# Patient Record
Sex: Female | Born: 2000 | Race: White | Hispanic: No | Marital: Single | Smoking: Never smoker
Health system: Southern US, Community
[De-identification: ages and names within clinical notes are randomized; demographics above are authoritative.]

## PROBLEM LIST (undated history)

## (undated) DIAGNOSIS — L409 Psoriasis, unspecified: Secondary | ICD-10-CM

## (undated) DIAGNOSIS — E079 Disorder of thyroid, unspecified: Secondary | ICD-10-CM

## (undated) DIAGNOSIS — K900 Celiac disease: Secondary | ICD-10-CM

## (undated) DIAGNOSIS — D649 Anemia, unspecified: Secondary | ICD-10-CM

## (undated) DIAGNOSIS — F419 Anxiety disorder, unspecified: Secondary | ICD-10-CM

## (undated) HISTORY — DX: Celiac disease: K90.0

## (undated) HISTORY — DX: Anemia, unspecified: D64.9

---
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
## 2020-03-13 ENCOUNTER — Other Ambulatory Visit: Payer: Self-pay

## 2020-03-13 DIAGNOSIS — Z20822 Contact with and (suspected) exposure to covid-19: Secondary | ICD-10-CM

## 2020-03-16 LAB — NOVEL CORONAVIRUS, NAA: SARS-CoV-2, NAA: NOT DETECTED

## 2020-08-13 ENCOUNTER — Ambulatory Visit (INDEPENDENT_AMBULATORY_CARE_PROVIDER_SITE_OTHER): Payer: BC Managed Care – PPO

## 2020-08-13 ENCOUNTER — Other Ambulatory Visit: Payer: Self-pay

## 2020-08-13 ENCOUNTER — Ambulatory Visit
Admission: EM | Admit: 2020-08-13 | Discharge: 2020-08-13 | Disposition: A | Discharge status: Home / Self Care | Payer: BC Managed Care – PPO | Attending: Family Medicine | Admitting: Family Medicine

## 2020-08-13 DIAGNOSIS — R109 Unspecified abdominal pain: Secondary | ICD-10-CM

## 2020-08-13 LAB — POCT URINALYSIS DIP (DEVICE)
Bilirubin Urine: NEGATIVE
Glucose, UA: NEGATIVE mg/dL
Hgb urine dipstick: NEGATIVE
Ketones, ur: NEGATIVE mg/dL
Leukocytes,Ua: NEGATIVE
Nitrite: NEGATIVE
Protein, ur: NEGATIVE mg/dL
Specific Gravity, Urine: 1.015 (ref 1.005–1.030)
Urobilinogen, UA: 0.2 mg/dL (ref 0.0–1.0)
pH: 5.5 (ref 5.0–8.0)

## 2020-08-13 LAB — POCT PREGNANCY, URINE: Preg Test, Ur: NEGATIVE

## 2020-08-13 NOTE — ED Provider Notes (Signed)
[facility]    CSN: [id number] Arrival date & time: 08/13/20  1759  History   Chief Complaint Chief Complaint  Patient presents with   Flank Pain   HPI  20 year old female presents with the above complaint.  Patient reports that she developed low grade fever yesterday (Tmax 99.6). Also, developed R flank pain. She reports urinary frequency. No dysuria. She reports nausea. No vomiting. No relieving factors. No reports of hematuria. Pain 5/10 in severity.   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 No pertinent surgical history.  OB History   No obstetric history on file.      Home Medications    Prior to Admission medications   Medication Sig Start Date End Date Taking? Authorizing Provider  escitalopram (LEXAPRO) 5 MG tablet Take by mouth. 07/30/20  Yes [provider]  VYVANSE 10 MG CHEW Chew 1 tablet by mouth daily. 03/04/20  Yes [provider]  sucralfate (CARAFATE) 1 g tablet Take 1 tablet (1 g total) by mouth 2 (two) times daily as needed. 10/27/18 10/27/18  Verlee Monte, NP    Family History Family History  Problem Relation Age of Onset   Healthy Mother    Hypertension Father     Social History Social History   Tobacco Use   Smoking status: Never   Smokeless tobacco: Never  Vaping Use   Vaping Use: Never used  Substance Use Topics   Alcohol use: Never   Drug use: Never     Allergies   Sulfa  antibiotics   Review of Systems Review of Systems Per HPI  Physical Exam Triage Vital Signs ED Triage Vitals  Enc Vitals Group     BP 08/13/20 1837 109/69     Pulse Rate 08/13/20 1837 95     Resp 08/13/20 1837 18     Temp 08/13/20 1837 99 F (37.2 C)     Temp Source 08/13/20 1837 Oral     SpO2 08/13/20 1837 99 %     Weight 08/13/20 1834 92 lb (41.7 kg)     Height 08/13/20 1834 5\' 1"  (1.549 m)     Head Circumference --      Peak Flow --      Pain Score 08/13/20 1834 5     Pain Loc --      Pain Edu? --      Excl. in GC? --    Updated Vital Signs BP 109/69 (BP Location: Left Arm)   Pulse 95   Temp 99 F (37.2 C) (Oral)   Resp 18   Ht 5\' 1"  (1.549 m)   Wt 41.7 kg   LMP 07/26/2020 Comment: neg preg test 08/13/20  SpO2 99%   BMI 17.38 kg/m   Visual Acuity Right Eye Distance:   Left Eye Distance:   Bilateral Distance:    Right Eye Near:   Left Eye Near:    Bilateral Near:  Physical Exam Vitals and nursing note reviewed.  Constitutional:      Appearance: Normal appearance.  HENT:     Head: Normocephalic and atraumatic.  Eyes:     General:        Right eye: No discharge.        Left eye: No discharge.     Conjunctiva/sclera: Conjunctivae normal.  Cardiovascular:     Rate and Rhythm: Normal rate and regular rhythm.  Pulmonary:     Effort: Pulmonary effort is normal.     Breath sounds: Normal breath sounds. No wheezing, rhonchi or rales.  Neurological:     Mental Status: She is alert.  Psychiatric:        Mood and Affect: Mood normal.        Behavior: Behavior normal.    UC Treatments / Results  Labs (all labs ordered are listed, but only abnormal results are displayed) Labs Reviewed  PREGNANCY, URINE  POCT URINALYSIS DIPSTICK, ED / UC  POCT PREGNANCY, URINE  POCT URINALYSIS DIP (DEVICE)    EKG   Radiology No results found.  Procedures Procedures (including critical care time)  Medications Ordered in UC Medications - No data to  display  Initial Impression / Assessment and Plan / UC Course  I have reviewed the triage vital signs and the nursing notes.  Pertinent labs & imaging results that were available during my care of the patient were reviewed by me and considered in my medical decision making (see chart for details).    20 year old female presents with flank pain. Etiology & prognosis unclear at this time. UA negative. No evidence of hematuria or pyuria. KUB obtained and was independently reviewed by me. Interpretation: Normal. No evidence of stone. Exam benign. Overall, well appearing. Advised tylenol and supportive care. If worsens, advised to go to the ER for further evaluation.   Final Clinical Impressions(s) / UC Diagnoses   Final diagnoses:  Flank pain     Discharge Instructions      No evidence of UTI or kidney stone.  Her exam is unremarkable.   Tylenol as needed for pain. Keep a close eye. If she worsens, please take her to the ER for advanced imaging (CT) and labs.  Follow up with your PCP.  Take care  Dr. Adriana Simas    ED Prescriptions   None    PDMP not reviewed this encounter.   Tommie Sams, [place] 08/15/20 2301

## 2020-08-13 NOTE — Discharge Instructions (Addendum)
No evidence of UTI or kidney stone.  Her exam is unremarkable.   Tylenol as needed for pain. Keep a close eye. If she worsens, please take her to the ER for advanced imaging (CT) and labs.  Follow up with your PCP.  Take care  Dr. Adriana Simas

## 2020-08-13 NOTE — ED Triage Notes (Signed)
Patient states that she has been having a fever, back pain and low back pain since yesterday.

## 2021-08-20 ENCOUNTER — Ambulatory Visit
Admission: EM | Admit: 2021-08-20 | Discharge: 2021-08-20 | Disposition: A | Discharge status: Home / Self Care | Payer: BC Managed Care – PPO | Attending: Emergency Medicine | Admitting: Emergency Medicine

## 2021-08-20 DIAGNOSIS — L03031 Cellulitis of right toe: Secondary | ICD-10-CM

## 2021-08-20 MED ORDER — CHLORHEXIDINE GLUCONATE 4 % EX LIQD: Freq: Every day | CUTANEOUS | 0 refills | Status: DC | PRN

## 2021-08-20 MED ORDER — CEPHALEXIN 500 MG PO CAPS: 1000.0000 mg | ORAL_CAPSULE | Freq: Two times a day (BID) | ORAL | 0 refills | Status: AC

## 2021-08-20 NOTE — ED Provider Notes (Signed)
HPI  SUBJECTIVE:  Dawn Vaughan is a 21 y.o. female who presents with 2 days of erythema, swelling, increased temperature, throbbing constant pain at the lateral fold of her right first toe after picking at the skin.  She reports purulent drainage and erythema streaking up her toe.  No fevers, body aches.  She tried triple antibiotic ointment and wrapping it without improvement in her symptoms.  Symptoms are worse with walking and palpation.  Past medical history negative for MRSA.  LMP: Last night.  PCP: [facility] [place]   Past Medical History:  Diagnosis Date   Anxiety    Psoriasis    Thyroid disease     History reviewed. No pertinent surgical history.  Family History  Problem Relation Age of Onset   Healthy Mother    Hypertension Father     Social History   Tobacco Use   Smoking status: Never   Smokeless tobacco: Never  Vaping Use   Vaping Use: Never used  Substance Use Topics   Alcohol use: Never   Drug use: Never    No current facility-administered medications for this encounter.  Current Outpatient Medications:    cephALEXin (KEFLEX) 500 MG capsule, Take 2 capsules (1,000 mg total) by mouth 2 (two) times daily for 7 days., Disp: 28 capsule, Rfl: 0   chlorhexidine (HIBICLENS) 4 % external liquid, Apply topically daily as needed. Dilute 10-15 mL in water, Use daily when bathing for 1-2 weeks, Disp: 120 mL, Rfl: 0   escitalopram (LEXAPRO) 5 MG tablet, Take by mouth., Disp: , Rfl:    VYVANSE 10 MG CHEW, Chew 1 tablet by mouth daily., Disp: , Rfl:   Allergies  Allergen Reactions   Sulfa Antibiotics Hives     ROS  As noted in HPI.   Physical Exam  BP (!) 114/55 (BP Location: Left Arm)   Pulse 80   Temp 98.2 F (36.8 C) (Oral)   SpO2 99%   Constitutional: Well developed, well nourished, no acute distress Eyes:  EOMI, conjunctiva normal bilaterally HENT: Normocephalic, atraumatic,mucus membranes moist Respiratory: Normal inspiratory  effort Cardiovascular: Normal rate GI: nondistended skin: No rash, skin intact Musculoskeletal: Tender erythema, mild edema lateral nail fold right first toe.  No expressible purulent drainage.  No tenderness, swelling of the pad.   Neurologic: Alert & oriented x 3, no focal neuro deficits Psychiatric: Speech and behavior appropriate   ED Course   Medications - No data to display  No orders of the defined types were placed in this encounter.   No results found for this or any previous visit (from the past 24 hour(s)). No results found.  ED Clinical Impression  1. Cellulitis of right toe      ED Assessment/Plan  Patient with a cellulitis/early paronychia.  There does not appear to be anything to drain.  No evidence of felon.  With Keflex for 7 days, Hibiclens, Tylenol/ibuprofen.  Follow-up here with PCP as needed.  Discussed MDM, treatment plan, and plan for follow-up with patient.  patient agrees with plan.   Meds ordered this encounter  Medications   chlorhexidine (HIBICLENS) 4 % external liquid    Sig: Apply topically daily as needed. Dilute 10-15 mL in water, Use daily when bathing for 1-2 weeks    Dispense:  120 mL    Refill:  0   cephALEXin (KEFLEX) 500 MG capsule    Sig: Take 2 capsules (1,000 mg total) by mouth 2 (two) times daily for 7 days.  Dispense:  28 capsule    Refill:  0      *This clinic note was created using Scientist, clinical (histocompatibility and immunogenetics). Therefore, there may be occasional mistakes despite careful proofreading.  ?    Domenick Gong, MD 08/20/21 [phone]

## 2021-08-20 NOTE — ED Triage Notes (Signed)
Pt reports right foot big toe pain. She has been picking at the skin and it appears infected.

## 2021-08-20 NOTE — Discharge Instructions (Addendum)
Keep clean with Hibiclens soaks.  Continue antibiotic ointment.  Finish Keflex, even if you feel better.  400 mg of ibuprofen combined with 1000 mg of Tylenol 3-4 times a day as needed for pain.  Return here or see your doctor if you get worse.

## 2021-12-16 IMAGING — CR DG ABDOMEN 1V
2 series · 2 of 2 positions shown · non-contrast
Comparison: Chest radiograph 10/27/2018

CLINICAL DATA: Fever, back pain

EXAM:
ABDOMEN - 1 VIEW

[abdomen kub (1 of 2)]
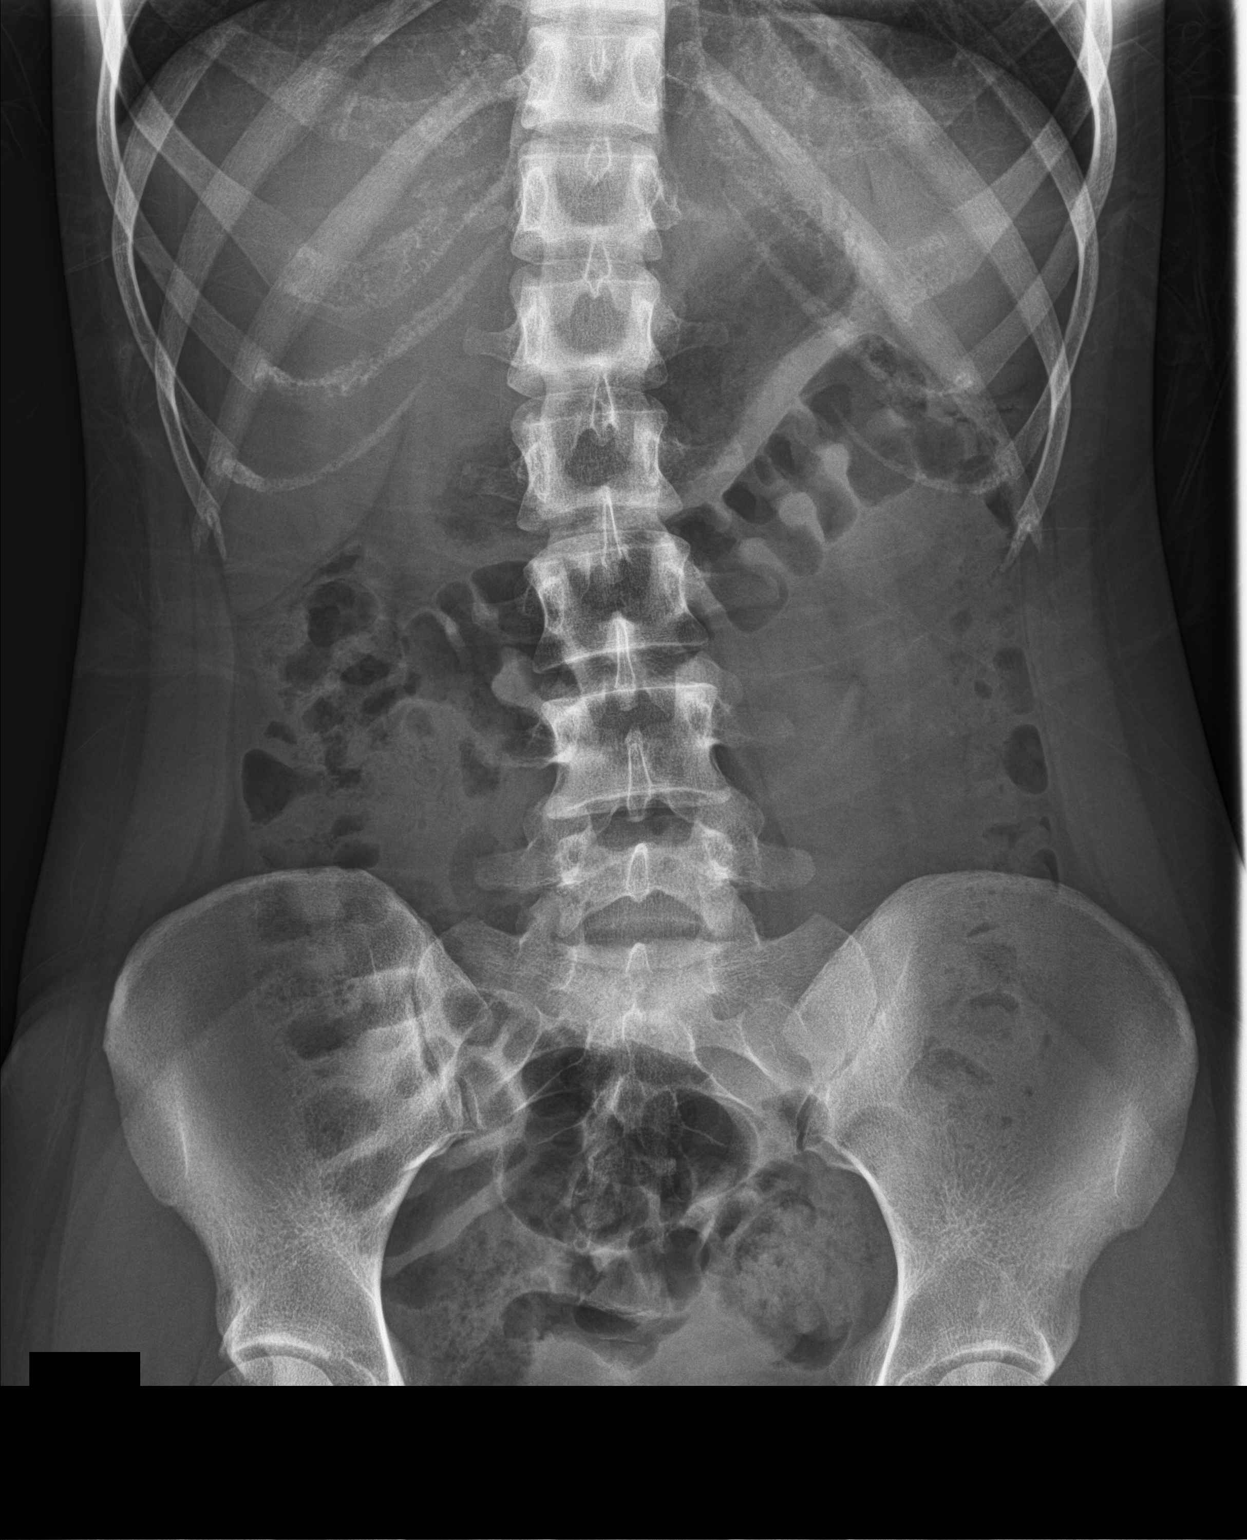

[abdomen kub (2 of 2)]
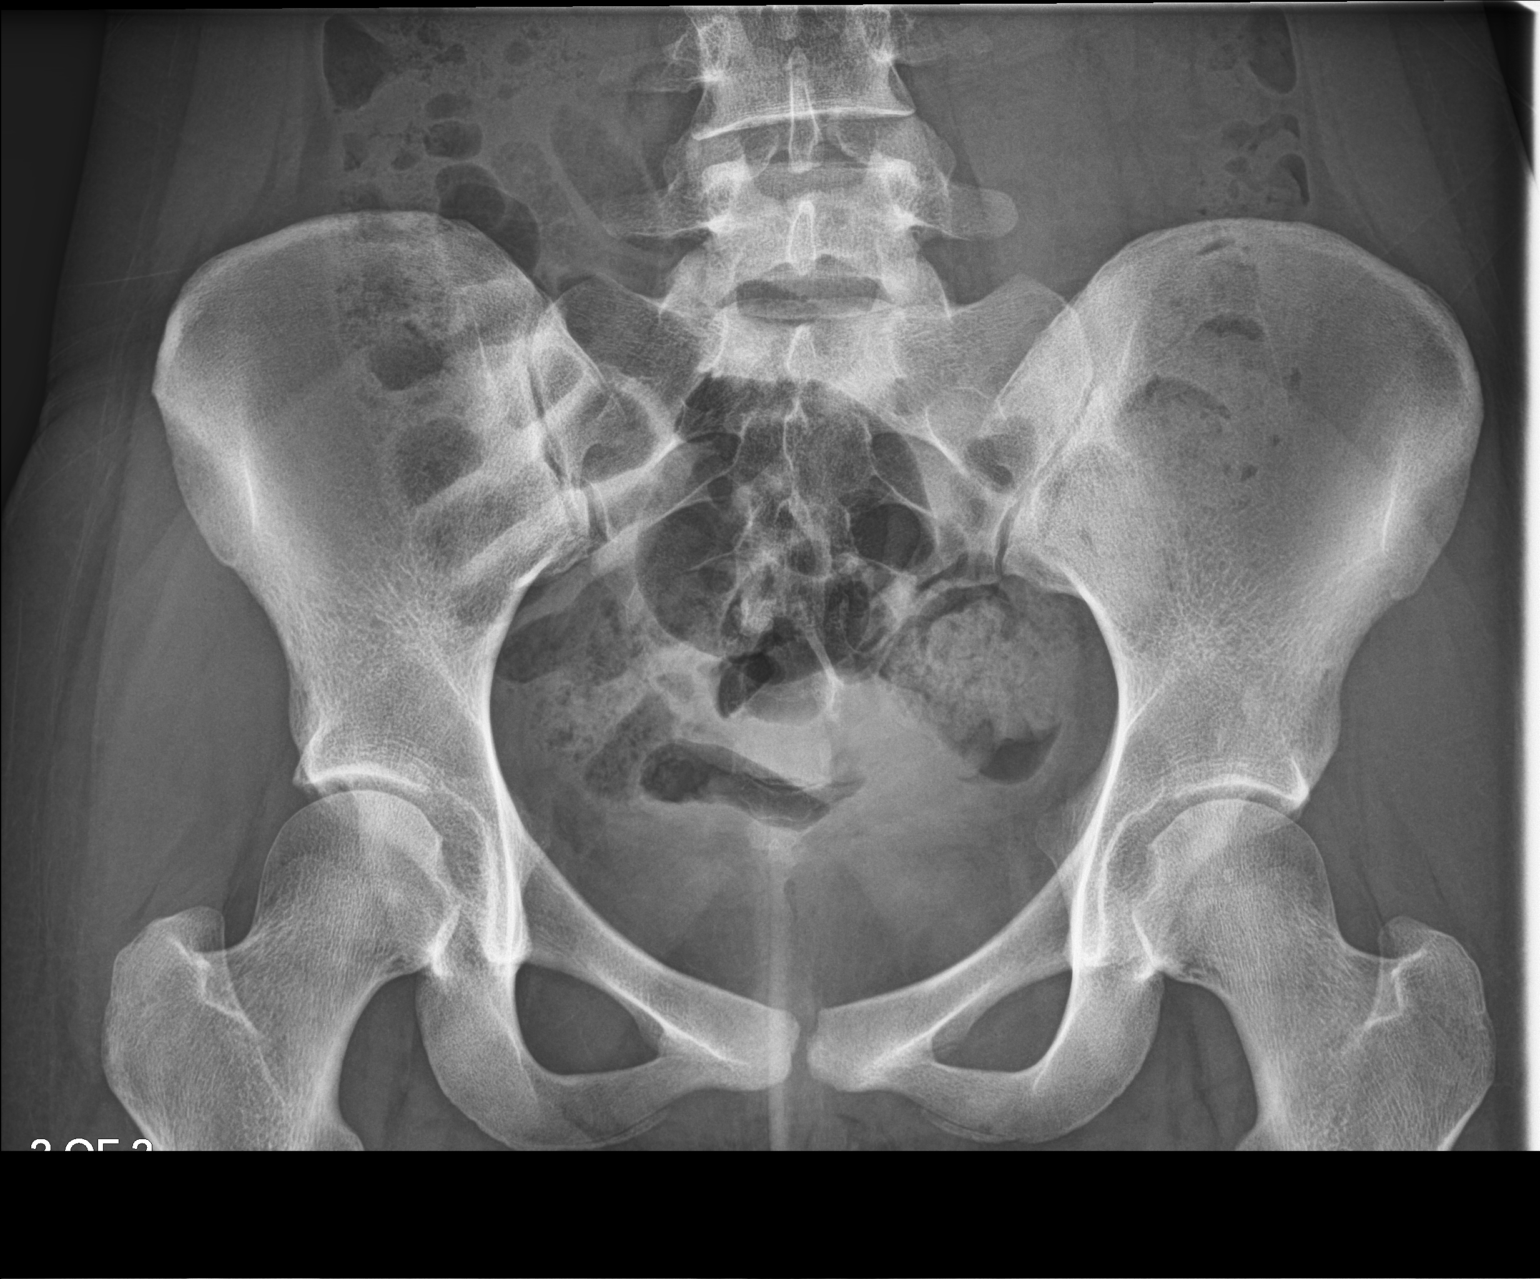

[2 of 2 positions shown; findings below may reference images not displayed]

FINDINGS: The bowel gas pattern is normal. No radio-opaque calculi or other
significant radiographic abnormality are seen.
IMPRESSION: Negative.

## 2023-05-07 ENCOUNTER — Emergency Department

## 2023-05-07 ENCOUNTER — Emergency Department
Admission: EM | Admit: 2023-05-07 | Discharge: 2023-05-07 | Disposition: A | Discharge status: Home / Self Care | Attending: Emergency Medicine | Admitting: Emergency Medicine

## 2023-05-07 ENCOUNTER — Other Ambulatory Visit: Payer: Self-pay

## 2023-05-07 DIAGNOSIS — R0789 Other chest pain: Secondary | ICD-10-CM | POA: Diagnosis not present

## 2023-05-07 DIAGNOSIS — D509 Iron deficiency anemia, unspecified: Secondary | ICD-10-CM | POA: Insufficient documentation

## 2023-05-07 DIAGNOSIS — D649 Anemia, unspecified: Secondary | ICD-10-CM

## 2023-05-07 DIAGNOSIS — R109 Unspecified abdominal pain: Secondary | ICD-10-CM | POA: Insufficient documentation

## 2023-05-07 DIAGNOSIS — R519 Headache, unspecified: Secondary | ICD-10-CM | POA: Insufficient documentation

## 2023-05-07 DIAGNOSIS — R079 Chest pain, unspecified: Secondary | ICD-10-CM | POA: Diagnosis present

## 2023-05-07 LAB — TROPONIN I (HIGH SENSITIVITY): Troponin I (High Sensitivity): 6 ng/L (ref <18)

## 2023-05-07 LAB — CBC
HCT: 29.3 % — ABNORMAL LOW (ref 36.0–46.0)
Hemoglobin: 7.8 g/dL — ABNORMAL LOW (ref 12.0–15.0)
MCH: 16.8 pg — ABNORMAL LOW (ref 26.0–34.0)
MCHC: 26.6 g/dL — ABNORMAL LOW (ref 30.0–36.0)
MCV: 63.1 fL — ABNORMAL LOW (ref 80.0–100.0)
Platelets: 296 10*3/uL (ref 150–400)
RBC: 4.64 MIL/uL (ref 3.87–5.11)
RDW: 17.4 % — ABNORMAL HIGH (ref 11.5–15.5)
WBC: 5.5 10*3/uL (ref 4.0–10.5)
nRBC: 0 % (ref 0.0–0.2)

## 2023-05-07 LAB — HCG, QUANTITATIVE, PREGNANCY: hCG, Beta Chain, Quant, S: 1 m[IU]/mL (ref <5)

## 2023-05-07 LAB — BASIC METABOLIC PANEL
Anion gap: 8 (ref 5–15)
BUN: 10 mg/dL (ref 6–20)
CO2: 22 mmol/L (ref 22–32)
Calcium: 9.4 mg/dL (ref 8.9–10.3)
Chloride: 105 mmol/L (ref 98–111)
Creatinine, Ser: 0.57 mg/dL (ref 0.44–1.00)
GFR, Estimated: >60 mL/min (ref >60)
Glucose, Bld: 96 mg/dL (ref 70–99)
Potassium: 3.8 mmol/L (ref 3.5–5.1)
Sodium: 135 mmol/L (ref 135–145)

## 2023-05-07 LAB — TYPE AND SCREEN
ABO/RH(D): A POS
Antibody Screen: NEGATIVE

## 2023-05-07 MED ORDER — FERROUS SULFATE 325 (65 FE) MG PO TBEC: 325.0000 mg | DELAYED_RELEASE_TABLET | Freq: Two times a day (BID) | ORAL | 3 refills | Status: AC

## 2023-05-07 NOTE — ED Provider Notes (Signed)
 [facility] Provider Note   Event Date/Time   First MD Initiated Contact with Patient 05/07/23 [phone]     (approximate) History  Chest Pain  HPI Dawn Vaughan is a 23 y.o. female was the past medical history of iron deficiency anemia who presents complaining of of intermittent chest pain/palpitations, headache, and mild abdominal pain that began today.  Patient states that she has history of iron deficiency anemia however has not been taking her iron supplementation over the past few months.  Patient states he normally gets care at [facility] and has not seen any hematology specialist in that area or this 1 since she been diagnosed with anemia. ROS: Patient currently denies any vision changes, tinnitus, difficulty speaking, facial droop, sore throat, shortness of breath, abdominal pain, nausea/vomiting/diarrhea, dysuria, or weakness/numbness/paresthesias in any extremity   Physical Exam  Triage Vital Signs: ED Triage Vitals  Encounter Vitals Group     BP 05/07/23 0345 119/86     Systolic BP Percentile --      Diastolic BP Percentile --      Pulse Rate 05/07/23 0345 87     Resp 05/07/23 0345 16     Temp 05/07/23 0345 97.9 F (36.6 C)     Temp Source 05/07/23 0345 Oral     SpO2 05/07/23 0345 100 %     Weight 05/07/23 0346 92 lb (41.7 kg)     Height 05/07/23 0346 5\' 1"  (1.549 m)     Head Circumference --      Peak Flow --      Pain Score 05/07/23 0346 2     Pain Loc --      Pain Education --      Exclude from Growth Chart --    Most recent vital signs: Vitals:   05/07/23 0345 05/07/23 0700  BP: 119/86 113/80  Pulse: 87 79  Resp: 16 18  Temp: 97.9 F (36.6 C) 98 F (36.7 C)  SpO2: 100% 100%   General: Awake, oriented x4.  Generalized and conjunctival pallor CV:  Good peripheral perfusion.  Resp:  Normal effort.  Abd:  No distention.  Other:  Young adult well-developed, well-nourished Caucasian female resting comfortably in no acute distress ED Results /  Procedures / Treatments  Labs (all labs ordered are listed, but only abnormal results are displayed) Labs Reviewed  CBC - Abnormal; Notable for the following components:      Result Value   Hemoglobin 7.8 (*)    HCT 29.3 (*)    MCV 63.1 (*)    MCH 16.8 (*)    MCHC 26.6 (*)    RDW 17.4 (*)    All other components within normal limits  BASIC METABOLIC PANEL  HCG, QUANTITATIVE, PREGNANCY  TYPE AND SCREEN  TROPONIN I (HIGH SENSITIVITY)  TROPONIN I (HIGH SENSITIVITY)   EKG ED ECG REPORT I, Merwyn Katos, the attending physician, personally viewed and interpreted this ECG. Date: 05/07/2023 EKG Time: 0352 Rate: 86 Rhythm: normal sinus rhythm QRS Axis: normal Intervals: normal ST/T Wave abnormalities: normal Narrative Interpretation: no evidence of acute ischemia RADIOLOGY ED MD interpretation: 2 view chest x-ray interpreted by me shows no evidence of acute abnormalities including no pneumonia, pneumothorax, or widened mediastinum -Agree with radiology assessment Official radiology report(s): DG Chest 2 View Result Date: 05/07/2023 CLINICAL DATA:  Chest pain EXAM: CHEST - 2 VIEW COMPARISON:  10/27/2018 FINDINGS: Normal heart size and mediastinal contours. No acute infiltrate or edema. No effusion or pneumothorax. No  acute osseous findings. EKG pads. IMPRESSION: No active cardiopulmonary disease. Electronically Signed   By: Tiburcio Pea M.D.   On: 05/07/2023 04:12   PROCEDURES: Critical Care performed: No Procedures MEDICATIONS ORDERED IN ED: Medications - No data to display IMPRESSION / MDM / ASSESSMENT AND PLAN / ED COURSE  I reviewed the triage vital signs and the nursing notes.                             The patient is on the cardiac monitor to evaluate for evidence of arrhythmia and/or significant heart rate changes. Patient's presentation is most consistent with acute presentation with potential threat to life or bodily function.  This patient presents to the ED for  concern of chest pain and headache on exertion, this involves an extensive number of treatment options, and is a complaint that carries with it a high risk of complications and morbidity.  The differential diagnosis includes symptomatic anemia, metrorrhagia, GI bleeding, ACS, pericarditis Co morbidities that complicate the patient evaluation  Iron deficiency anemia Additional history obtained:  Additional history obtained from significant other at bedside  External records from outside source obtained and reviewed including recent chart from [facility] during September 2024 Lab Tests:  I Ordered, and personally interpreted labs.  The pertinent results include: Hemoglobin 7.8, hematocrit 29.3, MCV 63.1 Imaging Studies ordered:  I ordered imaging studies including chest x-ray  I independently visualized and interpreted imaging which showed, no evidence of acute abnormalities  I agree with the radiologist interpretation Cardiac Monitoring: / EKG:  The patient was maintained on a cardiac monitor.  I personally viewed and interpreted the cardiac monitored which showed an underlying rhythm of: Normal sinus rhythm Problem List / ED Course / Critical interventions / Medication management  Symptomatic iron deficiency anemia  I ordered medication including prescription for ferrous sulfate.  For iron deficiency anemia  I have reviewed the patients home medicines and have made adjustments as needed including adding ferrous sulfate Test / Admission - Considered:  Admission was considered for this patient however hemoglobin does not meet levels for transfusion and patient not having any active bleeding at this time Dispo: Discharge home with hematology follow-up with Dr. Michae Kava       FINAL CLINICAL IMPRESSION(S) / ED DIAGNOSES   Final diagnoses:  Chest pain, unspecified type  Anemia, unspecified type   Rx / DC Orders   ED Discharge Orders          Ordered    ferrous sulfate 325 (65 FE)  MG EC tablet  2 times daily        05/07/23 0719           Note:  This document was prepared using Dragon voice recognition software and may include unintentional dictation errors.   Merwyn Katos, MD 05/07/23 [phone]

## 2023-05-07 NOTE — ED Triage Notes (Signed)
 Patient reports hx of anemia and having chest pain and headache.  Denies bleeding.  Denies ever having a blood transfusion.  Reports its been as low as 7.

## 2023-05-16 ENCOUNTER — Inpatient Hospital Stay: Admitting: Internal Medicine

## 2023-05-16 ENCOUNTER — Inpatient Hospital Stay

## 2023-05-22 ENCOUNTER — Inpatient Hospital Stay: Attending: Internal Medicine | Admitting: Internal Medicine

## 2023-05-22 ENCOUNTER — Encounter: Payer: Self-pay | Admitting: Internal Medicine

## 2023-05-22 ENCOUNTER — Inpatient Hospital Stay

## 2023-05-22 VITALS — BP 88/60 | HR 93 | Temp 97.7°F | Resp 17 | Ht 61.0 in | Wt 95.4 lb

## 2023-05-22 DIAGNOSIS — D649 Anemia, unspecified: Secondary | ICD-10-CM | POA: Diagnosis present

## 2023-05-22 DIAGNOSIS — E611 Iron deficiency: Secondary | ICD-10-CM | POA: Diagnosis not present

## 2023-05-22 DIAGNOSIS — K900 Celiac disease: Secondary | ICD-10-CM | POA: Insufficient documentation

## 2023-05-22 NOTE — Progress Notes (Signed)
 [facility] CONSULT NOTE  Patient Care Team: Bodnar, Genevieve Norlander, MD as PCP - General (Pediatrics) Earna Coder, MD as Consulting Physician (Oncology)  CHIEF COMPLAINTS/PURPOSE OF CONSULTATION: ANEMIA   HEMATOLOGY HISTORY  # ANEMIA[Hb; MCV-platelets- WBC; Iron sat; ferritin;  GFR- CT/US- ;   HISTORY OF PRESENTING ILLNESS:  Dawn Vaughan 23 y.o.  female pleasant patient and Hx of has celiac disease is  been referred to [place] for further evaluation of anemia.  Pt is taking ferrous sulfate intermittently due to stomach discomfort. Menstrual cycle started yesterday which is impacting on her fatigue today. In general states menstruation causes pain not heavy in volume.   Went to ED with CP, weakness, dizziness, dyspnea. She received transfusions in the ED. Pt reports she has been having anemia for about 1 year now. Has not established Hematology care anywhere until today.   Patient complains of shortness of breath with exertion.  Also complains of excessive fatigue.  Complains of dizziness especially on standing.  Pica: ice  Blood in stools: none; EGD/colonoscopy-never [sisters/and the blood work]- no GI doctor.  Blood in urine:none Change of bowel movement/constipation: none.  Prior blood transfusion: none  Kidney/Liver disease:none Alcohol: none Bariatric surgery:none  Vaginal bleeding: moderate- not very heavy.  Prior evaluation with hematology:none Prior bone marrow biopsy: none Oral iron: intermitent.    Review of Systems  Constitutional:  Positive for malaise/fatigue. Negative for chills, diaphoresis, fever and weight loss.  HENT:  Negative for nosebleeds and sore throat.   Eyes:  Negative for double vision.  Respiratory:  Positive for shortness of breath. Negative for cough, hemoptysis, sputum production and wheezing.   Cardiovascular:  Positive for palpitations. Negative for chest pain, orthopnea and leg swelling.  Gastrointestinal:  Negative for  abdominal pain, blood in stool, constipation, diarrhea, heartburn, melena, nausea and vomiting.  Genitourinary:  Negative for dysuria, frequency and urgency.  Musculoskeletal:  Negative for back pain and joint pain.  Skin: Negative.  Negative for itching and rash.  Neurological:  Positive for dizziness. Negative for tingling, focal weakness, weakness and headaches.  Endo/Heme/Allergies:  Does not bruise/bleed easily.  Psychiatric/Behavioral:  Negative for depression. The patient is not nervous/anxious and does not have insomnia.     MEDICAL HISTORY:  Past Medical History:  Diagnosis Date   Anemia    Anxiety    Celiac disease    Psoriasis    Thyroid disease     SURGICAL HISTORY: History reviewed. No pertinent surgical history.  SOCIAL HISTORY: Social History   Socioeconomic History   Marital status: Single    Spouse name: Not on file   Number of children: Not on file   Years of education: Not on file   Highest education level: Not on file  Occupational History   Not on file  Tobacco Use   Smoking status: Never   Smokeless tobacco: Never  Vaping Use   Vaping status: Never Used  Substance and Sexual Activity   Alcohol use: Yes    Alcohol/week: 1.0 standard drink of alcohol    Types: 1 Glasses of wine per week    Comment: once in a while   Drug use: Never   Sexual activity: Yes    Comment: Not able to have intercoarse ; vagansmus  Other Topics Concern   Not on file  Social History Narrative   Not on file   Social Drivers of Health   Financial Resource Strain: Not on file  Food Insecurity: No Food Insecurity (05/22/2023)  Hunger Vital Sign    Worried About Running Out of Food in the Last Year: Never true    Ran Out of Food in the Last Year: Never true  Transportation Needs: No Transportation Needs (05/22/2023)   PRAPARE - Administrator, Civil Service (Medical): No    Lack of Transportation (Non-Medical): No  Physical Activity: Not on file  Stress:  Not on file  Social Connections: Not on file  Intimate Partner Violence: Not At Risk (05/22/2023)   Humiliation, Afraid, Rape, and Kick questionnaire    Fear of Current or Ex-Partner: No    Emotionally Abused: No    Physically Abused: No    Sexually Abused: No    FAMILY HISTORY: Family History  Problem Relation Age of Onset   Healthy Mother    Hypertension Father     ALLERGIES:  is allergic to sulfa antibiotics.  MEDICATIONS:  Current Outpatient Medications  Medication Sig Dispense Refill   escitalopram (LEXAPRO) 5 MG tablet Take by mouth.     ferrous sulfate 325 (65 FE) MG EC tablet Take 1 tablet (325 mg total) by mouth 2 (two) times daily. 60 tablet 3   VYVANSE 10 MG CHEW Chew 1 tablet by mouth daily. (Patient not taking: Reported on 05/22/2023)     No current facility-administered medications for this visit.     PHYSICAL EXAMINATION:   Vitals:   05/22/23 1140  BP: (!) 88/60  Pulse: 93  Resp: 17  Temp: 97.7 F (36.5 C)  SpO2: 99%   Filed Weights   05/22/23 1140  Weight: 95 lb 6.4 oz (43.3 kg)    Appears pale.   Physical Exam Vitals and nursing note reviewed.  HENT:     Head: Normocephalic and atraumatic.     Mouth/Throat:     Pharynx: Oropharynx is clear.  Eyes:     Extraocular Movements: Extraocular movements intact.     Pupils: Pupils are equal, round, and reactive to light.  Cardiovascular:     Rate and Rhythm: Normal rate and regular rhythm.  Abdominal:     Palpations: Abdomen is soft.  Musculoskeletal:        General: Normal range of motion.     Cervical back: Normal range of motion.  Skin:    General: Skin is warm.  Neurological:     General: No focal deficit present.     Mental Status: She is alert and oriented to person, place, and time.  Psychiatric:        Behavior: Behavior normal.        Judgment: Judgment normal.      LABORATORY DATA:  I have reviewed the data as listed Lab Results  Component Value Date   WBC 5.5 05/07/2023    HGB 7.8 (L) 05/07/2023   HCT 29.3 (L) 05/07/2023   MCV 63.1 (L) 05/07/2023   PLT 296 05/07/2023   Recent Labs    05/07/23 0353  NA 135  K 3.8  CL 105  CO2 22  GLUCOSE 96  BUN 10  CREATININE 0.57  CALCIUM 9.4  GFRNONAA >60     DG Chest 2 View Result Date: 05/07/2023 CLINICAL DATA:  Chest pain EXAM: CHEST - 2 VIEW COMPARISON:  10/27/2018 FINDINGS: Normal heart size and mediastinal contours. No acute infiltrate or edema. No effusion or pneumothorax. No acute osseous findings. EKG pads. IMPRESSION: No active cardiopulmonary disease. Electronically Signed   By: Tiburcio Pea M.D.   On: 05/07/2023 04:12    ASSESSMENT &  PLAN:   Symptomatic anemia # Anemia- Symptomatic.  Likely due to iron deficiency - from etiology menstrual blood loss/ ? celiac malabsorption.  Poor tolerance/lack of improvement on oral iron.  Discussed regarding IV iron infusion/Venofer. Discussed the potential acute infusion reactions with IV iron; which are quite rare.  Patient understands the risk; will proceed with infusions.   # September 2024-n1.4 hemoglobin 8.2-March 2025-hemoglobin 7.8-MCV 68 suggestive of iron deficiency.   #Etiology of iron deficiency: Menstrual blood loss/celiac malabsorption will refer to GI-Dr. Tobi Bastos given history of celiac no prior EGD/colonoscopy.   Thank you Dr. Vicente Males for allowing me to participate in the care of your pleasant patient. Please do not hesitate to contact me with questions or concerns in the interim.  # DISPOSITION: # refer to Dr.Anna- GI re: Hx of celiac/anemia # NO labs today # weekly venofer x4 - start ASAP/this week   # follow up 2 months- MD; labs-urine pregnancy test- cbc/bmp;iron studies; ferritin; LDH- possible venofer- Dr.B    All questions were answered. The patient knows to call the clinic with any problems, questions or concerns.    Earna Coder, MD 05/22/2023 12:12 PM

## 2023-05-22 NOTE — Progress Notes (Signed)
 Pt is taking ferrous sulfate intermittently due to stomach discomfort. Menstrual cycle started yesterday which is impacting on her fatigue today. In general states menstruation causes pain not heavy in volume. Went to ED with CP, weakness, dizziness, dyspnea.She received transfusions in the ED. Pt reports she has been having anemia for about 1 year now. Has not established Hematology care anywhere until today. Has celiac disease.

## 2023-05-22 NOTE — Assessment & Plan Note (Addendum)
#   Anemia- Symptomatic.  Likely due to iron deficiency - from etiology menstrual blood loss/ ? celiac malabsorption.  Poor tolerance/lack of improvement on oral iron.  Discussed regarding IV iron infusion/Venofer. Discussed the potential acute infusion reactions with IV iron; which are quite rare.  Patient understands the risk; will proceed with infusions.   # September 2024-n1.4 hemoglobin 8.2-March 2025-hemoglobin 7.8-MCV 68 suggestive of iron deficiency.   #Etiology of iron deficiency: Menstrual blood loss/celiac malabsorption will refer to GI-Dr. Tobi Bastos given history of celiac no prior EGD/colonoscopy.   Thank you Dr. Vicente Males for allowing me to participate in the care of your pleasant patient. Please do not hesitate to contact me with questions or concerns in the interim.  # DISPOSITION: # refer to Dr.Anna- GI re: Hx of celiac/anemia # NO labs today # weekly venofer x4 - start ASAP/this week   # follow up 2 months- MD; labs-urine pregnancy test- cbc/bmp;iron studies; ferritin; LDH- possible venofer- Dr.B

## 2023-05-24 ENCOUNTER — Inpatient Hospital Stay

## 2023-05-24 ENCOUNTER — Telehealth: Payer: Self-pay | Admitting: *Deleted

## 2023-05-24 VITALS — BP 101/64 | HR 73 | Temp 98.1°F | Resp 16

## 2023-05-24 DIAGNOSIS — D649 Anemia, unspecified: Secondary | ICD-10-CM

## 2023-05-24 MED ORDER — IRON SUCROSE 20 MG/ML IV SOLN
200.0000 mg | Freq: Once | INTRAVENOUS | Status: AC
  Administered 2023-05-24: 200 mg via INTRAVENOUS

## 2023-05-24 MED ORDER — SODIUM CHLORIDE 0.9% FLUSH
10.0000 mL | Freq: Once | INTRAVENOUS | Status: AC | PRN
  Administered 2023-05-24: 10 mL
  Filled 2023-05-24: qty 10

## 2023-05-24 NOTE — Telephone Encounter (Signed)
 The pt also says that she has sore ear. I told her to take 2 tylenol and she just did before I got er on the phone. Dr. Donneta Romberg says that he has never had a a iv iron and get temperature. She can put a compress over the ear, get some claritin over the counter. She is going to eat rice and then get the med.

## 2023-06-01 ENCOUNTER — Inpatient Hospital Stay

## 2023-06-01 VITALS — BP 94/56 | HR 70 | Temp 97.0°F | Resp 16

## 2023-06-01 DIAGNOSIS — D649 Anemia, unspecified: Secondary | ICD-10-CM

## 2023-06-01 MED ORDER — IRON SUCROSE 20 MG/ML IV SOLN
200.0000 mg | Freq: Once | INTRAVENOUS | Status: AC
  Administered 2023-06-01: 200 mg via INTRAVENOUS
  Filled 2023-06-01: qty 10

## 2023-06-01 NOTE — Patient Instructions (Signed)

## 2023-06-07 ENCOUNTER — Inpatient Hospital Stay: Attending: Internal Medicine

## 2023-06-07 VITALS — BP 100/60 | HR 65 | Temp 98.0°F | Resp 16

## 2023-06-07 DIAGNOSIS — D649 Anemia, unspecified: Secondary | ICD-10-CM | POA: Diagnosis present

## 2023-06-07 MED ORDER — SODIUM CHLORIDE 0.9% FLUSH
10.0000 mL | Freq: Once | INTRAVENOUS | Status: AC | PRN
Start: 2023-06-07 — End: 2023-06-07
  Administered 2023-06-07: 10 mL
  Filled 2023-06-07: qty 10

## 2023-06-07 MED ORDER — IRON SUCROSE 20 MG/ML IV SOLN
200.0000 mg | Freq: Once | INTRAVENOUS | Status: AC
  Administered 2023-06-07: 200 mg via INTRAVENOUS
  Filled 2023-06-07: qty 10

## 2023-06-07 NOTE — Progress Notes (Signed)
Patient tolerated Venofer infusion well, no questions/concerns voiced. Monitored 30 min post transfusion. Patient stable at discharge. VSS. AVS given.

## 2023-06-07 NOTE — Patient Instructions (Signed)

## 2023-06-14 ENCOUNTER — Inpatient Hospital Stay

## 2023-06-14 VITALS — BP 100/68 | HR 65 | Temp 97.3°F | Resp 18

## 2023-06-14 DIAGNOSIS — D649 Anemia, unspecified: Secondary | ICD-10-CM | POA: Diagnosis not present

## 2023-06-14 MED ORDER — IRON SUCROSE 20 MG/ML IV SOLN
200.0000 mg | Freq: Once | INTRAVENOUS | Status: AC
  Administered 2023-06-14: 200 mg via INTRAVENOUS

## 2023-06-14 NOTE — Patient Instructions (Signed)

## 2023-07-06 ENCOUNTER — Telehealth: Payer: Self-pay

## 2023-07-06 NOTE — Telephone Encounter (Signed)
 FYI  I spoke with Edwina Gram at Dr. Lindsay Rho office in regards to the referral for anemia. She states they have lost a few providers and do not have any appts until Aug 2025. She suggests that we send referral to [facility]. Referral sent to [facility].

## 2023-07-20 ENCOUNTER — Telehealth: Payer: Self-pay

## 2023-07-20 NOTE — Telephone Encounter (Signed)
 I spoke with Megan at [facility], pt had appt 07/17/23, pt cancelled and didn't r/s.

## 2023-07-24 ENCOUNTER — Inpatient Hospital Stay: Admitting: Internal Medicine

## 2023-07-24 ENCOUNTER — Inpatient Hospital Stay

## 2023-07-24 ENCOUNTER — Encounter: Payer: Self-pay | Admitting: Internal Medicine

## 2023-07-24 ENCOUNTER — Inpatient Hospital Stay: Attending: Internal Medicine

## 2023-07-24 VITALS — BP 106/69 | HR 80 | Temp 97.6°F | Resp 16 | Ht 61.0 in | Wt 99.8 lb

## 2023-07-24 VITALS — BP 95/51 | HR 84 | Temp 98.6°F | Resp 17

## 2023-07-24 DIAGNOSIS — Z79899 Other long term (current) drug therapy: Secondary | ICD-10-CM | POA: Diagnosis not present

## 2023-07-24 DIAGNOSIS — D649 Anemia, unspecified: Secondary | ICD-10-CM

## 2023-07-24 DIAGNOSIS — E611 Iron deficiency: Secondary | ICD-10-CM | POA: Diagnosis not present

## 2023-07-24 LAB — BASIC METABOLIC PANEL WITH GFR
Anion gap: 7 (ref 5–15)
BUN: 11 mg/dL (ref 6–20)
CO2: 26 mmol/L (ref 22–32)
Calcium: 9.1 mg/dL (ref 8.9–10.3)
Chloride: 104 mmol/L (ref 98–111)
Creatinine, Ser: 0.78 mg/dL (ref 0.44–1.00)
GFR, Estimated: >60 mL/min (ref >60)
Glucose, Bld: 90 mg/dL (ref 70–99)
Potassium: 4.1 mmol/L (ref 3.5–5.1)
Sodium: 137 mmol/L (ref 135–145)

## 2023-07-24 LAB — CBC WITH DIFFERENTIAL (CANCER CENTER ONLY)
Abs Immature Granulocytes: 0.01 10*3/uL (ref 0.00–0.07)
Basophils Absolute: 0 10*3/uL (ref 0.0–0.1)
Basophils Relative: 1 %
Eosinophils Absolute: 0.1 10*3/uL (ref 0.0–0.5)
Eosinophils Relative: 2 %
HCT: 40.8 % (ref 36.0–46.0)
Hemoglobin: 13 g/dL (ref 12.0–15.0)
Immature Granulocytes: 0 %
Lymphocytes Relative: 27 %
Lymphs Abs: 1.2 10*3/uL (ref 0.7–4.0)
MCH: 26.1 pg (ref 26.0–34.0)
MCHC: 31.9 g/dL (ref 30.0–36.0)
MCV: 81.8 fL (ref 80.0–100.0)
Monocytes Absolute: 0.3 10*3/uL (ref 0.1–1.0)
Monocytes Relative: 7 %
Neutro Abs: 2.9 10*3/uL (ref 1.7–7.7)
Neutrophils Relative %: 63 %
Platelet Count: 239 10*3/uL (ref 150–400)
RBC: 4.99 MIL/uL (ref 3.87–5.11)
RDW: 22.1 % — ABNORMAL HIGH (ref 11.5–15.5)
WBC Count: 4.6 10*3/uL (ref 4.0–10.5)
nRBC: 0 % (ref 0.0–0.2)

## 2023-07-24 LAB — PREGNANCY, URINE: Preg Test, Ur: NEGATIVE

## 2023-07-24 LAB — FERRITIN: Ferritin: 23 ng/mL (ref 11–307)

## 2023-07-24 LAB — IRON AND TIBC
Iron: 78 ug/dL (ref 28–170)
Saturation Ratios: 23 % (ref 10.4–31.8)
TIBC: 347 ug/dL (ref 250–450)
UIBC: 269 ug/dL

## 2023-07-24 LAB — LACTATE DEHYDROGENASE: LDH: 56 U/L — ABNORMAL LOW (ref 98–192)

## 2023-07-24 MED ORDER — IRON SUCROSE 20 MG/ML IV SOLN
200.0000 mg | Freq: Once | INTRAVENOUS | Status: AC
  Administered 2023-07-24: 200 mg via INTRAVENOUS

## 2023-07-24 MED ORDER — SODIUM CHLORIDE 0.9% FLUSH
10.0000 mL | Freq: Once | INTRAVENOUS | Status: AC | PRN
  Administered 2023-07-24: 10 mL
  Filled 2023-07-24: qty 10

## 2023-07-24 NOTE — Assessment & Plan Note (Signed)
#   Anemia- Symptomatic.  Likely due to iron  deficiency - from etiology menstrual blood loss/ ? celiac malabsorption.  Poor tolerance/lack of improvement on oral iron . MARCH 2025- hb 7.8- March 2025-hemoglobin 7.8-MCV 68 suggestive of iron  deficiency.    # S/p IV iron  infusion/ Venofer - MAY 2025- Hb 13- #Recommend gentle iron  [iron  biglycinate; 28 mg ] 1 pill a day.  This pill is unlikely to cause stomach upset or cause constipation- if issues every other day.   # #Etiology of iron  deficiency: Menstrual blood loss [ less likely celiac malabsorption- declined- GI-Dr. Anna]-  # DISPOSITION: # venofer -  # follow up 4  months- MD; labs-urine pregnancy test- cbc/bmp;iron  studies; ferritin; possible venofer - Dr.B

## 2023-07-24 NOTE — Progress Notes (Signed)
 Pt had referral to [facility], I spoke to [place] at [facility], she said pt had appt 07/17/23 that she cancelled and didn't r/s.  Fatigue/weakness: YES Dyspena: YES Light headedness: YES OCC. Blood in stool: NO

## 2023-07-24 NOTE — Progress Notes (Signed)
 [facility] CONSULT NOTE  Patient Care Team: Bodnar, Jerryl Morin, MD as PCP - General (Pediatrics) Gwyn Leos, MD as Consulting Physician (Oncology)  CHIEF COMPLAINTS/PURPOSE OF CONSULTATION: ANEMIA   HEMATOLOGY HISTORY  # ANEMIA[Hb; MCV-platelets- WBC; Iron  sat; ferritin;  GFR- CT/US - ;   HISTORY OF PRESENTING ILLNESS:  Dawn Vaughan 23 y.o.  female pleasant patient and Hx of has celiac disease/heavy menstrual cycles is here for a follow-up anemia.  Patient status post iron  infusions.  Not currently on oral iron  because of abdominal discomfort. Patient admits to heavy menstrual cycles for the first 2 days.    Patient had episode of fever after infusion.  Improved with antihistamine.  Review of Systems  Constitutional:  Negative for chills, diaphoresis, fever and weight loss.  HENT:  Negative for nosebleeds and sore throat.   Eyes:  Negative for double vision.  Respiratory:  Negative for cough, hemoptysis, sputum production and wheezing.   Cardiovascular:  Negative for chest pain, orthopnea and leg swelling.  Gastrointestinal:  Negative for abdominal pain, blood in stool, constipation, diarrhea, heartburn, melena, nausea and vomiting.  Genitourinary:  Negative for dysuria, frequency and urgency.  Musculoskeletal:  Negative for back pain and joint pain.  Skin: Negative.  Negative for itching and rash.  Neurological:  Negative for tingling, focal weakness, weakness and headaches.  Endo/Heme/Allergies:  Does not bruise/bleed easily.  Psychiatric/Behavioral:  Negative for depression. The patient is not nervous/anxious and does not have insomnia.     MEDICAL HISTORY:  Past Medical History:  Diagnosis Date   Anemia    Anxiety    Celiac disease    Psoriasis    Thyroid disease     SURGICAL HISTORY: History reviewed. No pertinent surgical history.  SOCIAL HISTORY: Social History   Socioeconomic History   Marital status: Single    Spouse name:  Not on file   Number of children: Not on file   Years of education: Not on file   Highest education level: Not on file  Occupational History   Not on file  Tobacco Use   Smoking status: Never   Smokeless tobacco: Never  Vaping Use   Vaping status: Never Used  Substance and Sexual Activity   Alcohol use: Yes    Alcohol/week: 1.0 standard drink of alcohol    Types: 1 Glasses of wine per week    Comment: once in a while   Drug use: Never   Sexual activity: Yes    Comment: Not able to have intercoarse ; vagansmus  Other Topics Concern   Not on file  Social History Narrative   Not on file   Social Drivers of Health   Financial Resource Strain: Not on file  Food Insecurity: No Food Insecurity (05/22/2023)   Hunger Vital Sign    Worried About Running Out of Food in the Last Year: Never true    Ran Out of Food in the Last Year: Never true  Transportation Needs: No Transportation Needs (05/22/2023)   PRAPARE - Administrator, Civil Service (Medical): No    Lack of Transportation (Non-Medical): No  Physical Activity: Not on file  Stress: Not on file  Social Connections: Not on file  Intimate Partner Violence: Not At Risk (05/22/2023)   Humiliation, Afraid, Rape, and Kick questionnaire    Fear of Current or Ex-Partner: No    Emotionally Abused: No    Physically Abused: No    Sexually Abused: No    FAMILY HISTORY: Family  History  Problem Relation Age of Onset   Healthy Mother    Hypertension Father     ALLERGIES:  is allergic to sulfa antibiotics.  MEDICATIONS:  Current Outpatient Medications  Medication Sig Dispense Refill   escitalopram (LEXAPRO) 5 MG tablet Take by mouth.     ferrous sulfate  325 (65 FE) MG EC tablet Take 1 tablet (325 mg total) by mouth 2 (two) times daily. (Patient not taking: Reported on 07/24/2023) 60 tablet 3   VYVANSE 10 MG CHEW Chew 1 tablet by mouth daily. (Patient not taking: Reported on 07/24/2023)     No current  facility-administered medications for this visit.     PHYSICAL EXAMINATION:   Vitals:   07/24/23 1008  BP: 106/69  Pulse: 80  Resp: 16  Temp: 97.6 F (36.4 C)  SpO2: 100%   Filed Weights   07/24/23 1008  Weight: 99 lb 12.8 oz (45.3 kg)    Appears pale.   Physical Exam Vitals and nursing note reviewed.  HENT:     Head: Normocephalic and atraumatic.     Mouth/Throat:     Pharynx: Oropharynx is clear.  Eyes:     Extraocular Movements: Extraocular movements intact.     Pupils: Pupils are equal, round, and reactive to light.  Cardiovascular:     Rate and Rhythm: Normal rate and regular rhythm.  Abdominal:     Palpations: Abdomen is soft.  Musculoskeletal:        General: Normal range of motion.     Cervical back: Normal range of motion.  Skin:    General: Skin is warm.  Neurological:     General: No focal deficit present.     Mental Status: She is alert and oriented to person, place, and time.  Psychiatric:        Behavior: Behavior normal.        Judgment: Judgment normal.      LABORATORY DATA:  I have reviewed the data as listed Lab Results  Component Value Date   WBC 4.6 07/24/2023   HGB 13.0 07/24/2023   HCT 40.8 07/24/2023   MCV 81.8 07/24/2023   PLT 239 07/24/2023   Recent Labs    05/07/23 0353  NA 135  K 3.8  CL 105  CO2 22  GLUCOSE 96  BUN 10  CREATININE 0.57  CALCIUM 9.4  GFRNONAA >60     No results found.   ASSESSMENT & PLAN:   Symptomatic anemia # Anemia- Symptomatic.  Likely due to iron  deficiency - from etiology menstrual blood loss/ ? celiac malabsorption.  Poor tolerance/lack of improvement on oral iron . MARCH 2025- hb 7.8- March 2025-hemoglobin 7.8-MCV 68 suggestive of iron  deficiency.    # S/p IV iron  infusion/ Venofer - MAY 2025- Hb 13- #Recommend gentle iron  [iron  biglycinate; 28 mg ] 1 pill a day.  This pill is unlikely to cause stomach upset or cause constipation- if issues every other day.   # #Etiology of iron   deficiency: Menstrual blood loss [ less likely celiac malabsorption- declined- GI-Dr. Anna]-  # DISPOSITION: # venofer -  # follow up 4  months- MD; labs-urine pregnancy test- cbc/bmp;iron  studies; ferritin; possible venofer - Dr.B    All questions were answered. The patient knows to call the clinic with any problems, questions or concerns.    Gwyn Leos, MD 07/24/2023 11:03 AM

## 2023-11-17 ENCOUNTER — Encounter: Payer: Self-pay | Admitting: Internal Medicine

## 2023-11-24 ENCOUNTER — Inpatient Hospital Stay: Payer: Self-pay

## 2023-11-24 ENCOUNTER — Encounter: Payer: Self-pay | Admitting: Internal Medicine

## 2023-11-24 ENCOUNTER — Inpatient Hospital Stay: Payer: Self-pay | Admitting: Internal Medicine
